# Patient Record
Sex: Female | Born: 1975 | Race: White | Hispanic: No | Marital: Married | Smoking: Never smoker
Health system: Southern US, Community
[De-identification: ages and names within clinical notes are randomized; demographics above are authoritative.]

## PROBLEM LIST (undated history)

## (undated) DIAGNOSIS — J45909 Unspecified asthma, uncomplicated: Secondary | ICD-10-CM

## (undated) DIAGNOSIS — K429 Umbilical hernia without obstruction or gangrene: Secondary | ICD-10-CM

## (undated) HISTORY — PX: WISDOM TOOTH EXTRACTION: SHX21

---

## 2003-06-07 ENCOUNTER — Other Ambulatory Visit: Admission: RE | Admit: 2003-06-07 | Discharge: 2003-06-07 | Payer: Self-pay | Admitting: Obstetrics and Gynecology

## 2003-06-27 ENCOUNTER — Other Ambulatory Visit: Admission: RE | Admit: 2003-06-27 | Discharge: 2003-06-27 | Payer: Self-pay | Admitting: Obstetrics and Gynecology

## 2005-06-18 ENCOUNTER — Other Ambulatory Visit: Admission: RE | Admit: 2005-06-18 | Discharge: 2005-06-18 | Payer: Self-pay | Admitting: Obstetrics and Gynecology

## 2006-10-29 ENCOUNTER — Ambulatory Visit (HOSPITAL_COMMUNITY): Admission: RE | Admit: 2006-10-29 | Discharge: 2006-10-29 | Payer: Self-pay | Admitting: Obstetrics and Gynecology

## 2006-10-31 ENCOUNTER — Inpatient Hospital Stay (HOSPITAL_COMMUNITY): Admission: AD | Admit: 2006-10-31 | Discharge: 2006-10-31 | Payer: Self-pay | Admitting: Obstetrics and Gynecology

## 2006-12-10 ENCOUNTER — Inpatient Hospital Stay (HOSPITAL_COMMUNITY): Admission: AD | Admit: 2006-12-10 | Discharge: 2006-12-12 | Payer: Self-pay | Admitting: Obstetrics and Gynecology

## 2008-10-17 ENCOUNTER — Ambulatory Visit (HOSPITAL_COMMUNITY): Admission: RE | Admit: 2008-10-17 | Discharge: 2008-10-17 | Payer: Self-pay | Admitting: Obstetrics and Gynecology

## 2009-03-06 ENCOUNTER — Encounter (INDEPENDENT_AMBULATORY_CARE_PROVIDER_SITE_OTHER): Payer: Self-pay | Admitting: Obstetrics and Gynecology

## 2009-03-06 ENCOUNTER — Inpatient Hospital Stay (HOSPITAL_COMMUNITY): Admission: RE | Admit: 2009-03-06 | Discharge: 2009-03-08 | Payer: Self-pay | Admitting: Obstetrics and Gynecology

## 2010-11-17 LAB — CBC
HCT: 31.2 % — ABNORMAL LOW (ref 36.0–46.0)
Hemoglobin: 12.5 g/dL (ref 12.0–15.0)
MCV: 86.2 fL (ref 78.0–100.0)
RBC: 3.63 MIL/uL — ABNORMAL LOW (ref 3.87–5.11)
RBC: 4.26 MIL/uL (ref 3.87–5.11)
WBC: 12.2 10*3/uL — ABNORMAL HIGH (ref 4.0–10.5)

## 2010-11-17 LAB — CCBB MATERNAL DONOR DRAW

## 2010-11-17 LAB — RPR: RPR Ser Ql: NONREACTIVE

## 2010-12-27 NOTE — H&P (Signed)
NAMESHERIDA, DOBKINS              ACCOUNT NO.:  [id number]   MEDICAL RECORD NO.:  [id number]          PATIENT TYPE:  INP   LOCATION:  9165                          FACILITY:  [facility]   PHYSICIAN:  Sherron Monday, MD        DATE OF BIRTH:  08/15/1975   DATE OF ADMISSION:  12/10/2006  DATE OF DISCHARGE:                              HISTORY & PHYSICAL   ADMITTING DIAGNOSIS:  Intrauterine pregnancy at term, early labor.   HISTORY OF PRESENT ILLNESS:  A 35 year old Caucasian female, G1, P0 at  41 weeks who states she has had good fetal movement, no loss of fluid,  no vaginal bleeding, and occasional contractions. Her pregnancy has been  uncomplicated except for some lagging fundal height in the third  trimester with a normal growth scan after that.   PAST MEDICAL HISTORY:  Not significant.   PAST SURGICAL HISTORY:  Not significant.   PAST OB/GYN HISTORY:  G1 with no complications. No abnormal Pap smears.  No sexually transmitted diseases.   MEDICATIONS:  prenatal vitamins.   ALLERGIES:  NO KNOWN DRUG ALLERGIES.   SOCIAL HISTORY:  She denies alcohol or tobacco or drug use.  She is  married.   FAMILY HISTORY:  Significant for hypertension on her mother's side of  the family.   PHYSICAL EXAMINATION:  She is afebrile. Vital signs are stable.  GENERAL:     She is in no apparent distress.  CARDIOVASCULAR:  Regular rate, and rhythm.  LUNGS:  Clear to auscultation bilaterally.  ABDOMEN:  Soft, but is nontender.  PELVIC:  Exam in the office on 30th of April, she was 2, 50% and minus 2  station.   ASSESSMENT/PLAN:  She will be admitted to labor and delivery. Her  membranes will be ruptured, and she will be started on Pitocin. Her  labor will be monitored closely.      Sherron Monday, MD  Electronically Signed     JB/MEDQ  D:  12/10/2006  T:  12/10/2006  Job:  [id number]

## 2010-12-27 NOTE — Discharge Summary (Signed)
NAMEMEDA, DUDZINSKI              ACCOUNT NO.:  [id number]   MEDICAL RECORD NO.:  [id number]          PATIENT TYPE:  INP   LOCATION:  9123                          FACILITY:  [facility]   PHYSICIAN:  Malachi Pro. Ambrose Mantle, M.D. DATE OF BIRTH:  Oct 05, 1975   DATE OF ADMISSION:  12/10/2006  DATE OF DISCHARGE:  12/12/2006                               DISCHARGE SUMMARY   This is a 35 year old white female, para 0, gravida 1, at 41+ weeks'  gestation admitted with regular contractions.  The patient had an  uncomplicated prenatal course except for lagging fundal height with  normal growth scan, good fluid at 35 weeks.  Blood group and type O+,  negative antibody, GC and chlamydia negative, RPR nonreactive, rubella  immune, hepatitis B surface antigen negative.  One-hour Glucola 83.  Cystic fibrosis screen negative, quad screen negative.  Group B strep  negative.   The patient's past medical history revealed asthma as a child.  No known  drug allergies.  No surgeries.  No history of STDs.  No abnormal Paps.   The patient is married.   Has a family history of hypertension on her maternal side.   A 10-week ultrasound dated the pregnancy on November 27 at 18 weeks and  5 days; ultrasound with normal anatomy, posterior placenta, female infant.  At 35 weeks because of lagging fundal height, ultrasound showed an  estimated fetal weight at the 13th percentile.  Amniotic fluid index was  normal.   On admission, the patient's vital signs were normal.  Heart and lungs were normal.  The abdomen was soft.  Cervix was 2 cm and 70%.  Fetal heart tones showed occasional variable  decelerations with periods of decreased variability, improved with  oxygen and fluids.   At 2:40 a.m., Dr. Jackelyn Knife was called to see the patient for  decelerations and noticed more episodes of variable decelerations and  decreased variability but then improved.  Cervix was 3 cm, 80%.  By 5:45  a.m., the patient was comfortable  with an epidural.  Fetal heart tones  were overall reassuring with good variability, occasional variable  decelerations.  Cervix was 3 cm.  At 6:50 a.m., the cervix was 4 cm,  90%.  Artificial rupture of membranes produced clear fluid.  At 8:15  a.m., Dr. Senaida Ores saw the patient from variable decelerations.  Fetal  heart rate was notable for recurrent variable decelerations over the  last hour with approximately 50% of contractions, some decreased to as  much as 60 beats per minute.  Otherwise, there was excellent variability  and good scalp stimulation.  Cervix was 5 cm, 90%.  Dr. Senaida Ores  placed an intrauterine pressure catheter for amnioinfusion.  By 12:20  p.m., the patient was comfortable with her epidural.  Cervix was 9.5 cm,  100% vertex at a +1 station.  The patient pushed approximately 1 hour to  deliver a living female infant at 1:23 p.m., 5 pounds 13 ounces with  Apgars of 9 at one and 9 at five minutes.  Placenta was expressed at  1:27 p.m.  First-degree perineal and introital lacerations were  repaired  with 3-0 Vicryl in typical fashion.  Cervix prolapsed through the  introitus.  Blood loss was about 500 mL.  Cervix was inspected.  There  were no lacerations.  Postpartum, the patient did quite well and was  discharged on the second postpartum day.   LABORATORY DATA:  Initial hemoglobin 13.4, hematocrit 39.2,  white count  11,300, platelet count 170,000.  Follow-up hemoglobin 10.5, platelet  count 111,000.  RPR was nonreactive and rapid HIV screen was negative.   FINAL DIAGNOSES:  Intrauterine pregnancy at 41+ weeks, delivered vertex.   OPERATION:  Spontaneous delivery vertex, repair of small laceration.   FINAL CONDITION:  Improved.   INSTRUCTIONS:  Include our regular discharge instruction booklet.  The  patient declines analgesics at discharge, and she is advised to return  to the office in 6 weeks for follow-up examination.      Malachi Pro. Ambrose Mantle, M.D.   Electronically Signed     TFH/MEDQ  D:  12/12/2006  T:  12/12/2006  Job:  [id number]

## 2011-10-19 ENCOUNTER — Ambulatory Visit (INDEPENDENT_AMBULATORY_CARE_PROVIDER_SITE_OTHER): Payer: BC Managed Care – PPO | Admitting: Family Medicine

## 2011-10-19 VITALS — BP 118/74 | HR 77 | Temp 97.8°F | Resp 16 | Ht 64.5 in | Wt 124.4 lb

## 2011-10-19 DIAGNOSIS — Z3040 Encounter for surveillance of contraceptives, unspecified: Secondary | ICD-10-CM

## 2011-10-19 MED ORDER — MOXIFLOXACIN HCL (2X DAY) 0.5 % OP SOLN: 1.0000 [drp] | Freq: Two times a day (BID) | OPHTHALMIC | Status: DC

## 2011-10-19 MED ORDER — LEVOFLOXACIN 500 MG PO TABS: 500.0000 mg | ORAL_TABLET | Freq: Every day | ORAL | Status: AC

## 2011-10-19 NOTE — Progress Notes (Signed)
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  Patient has had sinus congestion, eye discharge, cough, and nasal discharge.  She saw Dr. Wynelle Link 6 days ago but cough has been worsening.  Developed diarrhea last 24 hours.  Some epistaxis.  Taking mucinex and antibiotic eye drops.    O:  NAD Eyes injected. TM's normal Nose:  mp discharge Throat:  Red and swollen tonsils Chest:  Strong cough, wheezes bilaterally.  No rales.  A:  Bronchitis, sinusitis, acute...worsening conjunctivitis P:  Levaquin 500 mg bid x7d Moxeza bid x 5 days

## 2011-10-28 ENCOUNTER — Telehealth: Payer: Self-pay

## 2011-10-28 MED ORDER — DPH-LIDO-ALHYDR-MGHYDR-SIMETH MT SUSP: 5.0000 mL | Freq: Four times a day (QID) | OROMUCOSAL | Status: DC | PRN

## 2011-10-28 NOTE — Telephone Encounter (Signed)
Spoke with pt gave msg from Maralyn Sago and advised to pick up RX from [facility]. Pt understood

## 2011-10-28 NOTE — Telephone Encounter (Signed)
Oral sores from Levaquin would be a very unusual reaction to the med, it is also possible her initial illness caused this symptom.  Since she is almost finished with her Levaquin (day 9) I suggest she stop her antibiotic and can rinse her mouth with Dukes Magic Mouthwash as needed for the next several days.  Eprescribed to [facility]

## 2011-10-28 NOTE — Telephone Encounter (Signed)
Pt called and reported that she has developed a rash (couple of red very sore spots on her tongue), starting Sunday but worse yesterday from the Levaquin. She only has one more dose tonight and it has really helped the sinusitis/bronchitis. Is there something we can give her for this rash, or should she take Benedryl? Pt reports she has no other SEs, no swelling, SOB, rash anywhere else, no yeast infection Sxs.

## 2015-12-24 DIAGNOSIS — R300 Dysuria: Secondary | ICD-10-CM | POA: Diagnosis not present

## 2015-12-24 DIAGNOSIS — N390 Urinary tract infection, site not specified: Secondary | ICD-10-CM | POA: Diagnosis not present

## 2016-05-19 DIAGNOSIS — D225 Melanocytic nevi of trunk: Secondary | ICD-10-CM | POA: Diagnosis not present

## 2016-05-19 DIAGNOSIS — D1800 Hemangioma unspecified site: Secondary | ICD-10-CM | POA: Diagnosis not present

## 2016-05-19 DIAGNOSIS — L814 Other melanin hyperpigmentation: Secondary | ICD-10-CM | POA: Diagnosis not present

## 2016-05-19 DIAGNOSIS — L821 Other seborrheic keratosis: Secondary | ICD-10-CM | POA: Diagnosis not present

## 2016-09-03 DIAGNOSIS — Z130 Encounter for screening for diseases of the blood and blood-forming organs and certain disorders involving the immune mechanism: Secondary | ICD-10-CM | POA: Diagnosis not present

## 2016-09-03 DIAGNOSIS — Z6823 Body mass index (BMI) 23.0-23.9, adult: Secondary | ICD-10-CM | POA: Diagnosis not present

## 2016-09-03 DIAGNOSIS — K429 Umbilical hernia without obstruction or gangrene: Secondary | ICD-10-CM | POA: Diagnosis not present

## 2016-09-03 DIAGNOSIS — Z975 Presence of (intrauterine) contraceptive device: Secondary | ICD-10-CM | POA: Diagnosis not present

## 2016-09-03 DIAGNOSIS — Z01419 Encounter for gynecological examination (general) (routine) without abnormal findings: Secondary | ICD-10-CM | POA: Diagnosis not present

## 2016-09-03 DIAGNOSIS — Z1231 Encounter for screening mammogram for malignant neoplasm of breast: Secondary | ICD-10-CM | POA: Diagnosis not present

## 2016-09-03 DIAGNOSIS — Z1389 Encounter for screening for other disorder: Secondary | ICD-10-CM | POA: Diagnosis not present

## 2016-09-11 ENCOUNTER — Other Ambulatory Visit: Payer: Self-pay | Admitting: Obstetrics and Gynecology

## 2016-09-11 DIAGNOSIS — N6489 Other specified disorders of breast: Secondary | ICD-10-CM

## 2016-09-11 DIAGNOSIS — N6310 Unspecified lump in the right breast, unspecified quadrant: Secondary | ICD-10-CM

## 2016-09-12 DIAGNOSIS — K429 Umbilical hernia without obstruction or gangrene: Secondary | ICD-10-CM | POA: Diagnosis not present

## 2016-09-17 ENCOUNTER — Ambulatory Visit
Admission: RE | Admit: 2016-09-17 | Discharge: 2016-09-17 | Disposition: A | Discharge status: Home / Self Care | Payer: BLUE CROSS/BLUE SHIELD | Source: Ambulatory Visit | Attending: Obstetrics and Gynecology | Admitting: Obstetrics and Gynecology

## 2016-09-17 ENCOUNTER — Other Ambulatory Visit: Payer: Self-pay | Admitting: Obstetrics and Gynecology

## 2016-09-17 DIAGNOSIS — N6310 Unspecified lump in the right breast, unspecified quadrant: Secondary | ICD-10-CM

## 2016-09-17 DIAGNOSIS — N6489 Other specified disorders of breast: Secondary | ICD-10-CM

## 2016-09-17 DIAGNOSIS — N6312 Unspecified lump in the right breast, upper inner quadrant: Secondary | ICD-10-CM | POA: Diagnosis not present

## 2016-09-17 DIAGNOSIS — N6311 Unspecified lump in the right breast, upper outer quadrant: Secondary | ICD-10-CM | POA: Diagnosis not present

## 2016-09-29 DIAGNOSIS — J029 Acute pharyngitis, unspecified: Secondary | ICD-10-CM | POA: Diagnosis not present

## 2016-12-23 DIAGNOSIS — H5213 Myopia, bilateral: Secondary | ICD-10-CM | POA: Diagnosis not present

## 2017-04-16 ENCOUNTER — Ambulatory Visit: Payer: Self-pay | Admitting: General Surgery

## 2017-05-20 ENCOUNTER — Encounter (HOSPITAL_BASED_OUTPATIENT_CLINIC_OR_DEPARTMENT_OTHER): Payer: Self-pay | Admitting: *Deleted

## 2017-05-20 NOTE — Progress Notes (Signed)
Npo after midnight arrive 630 am 05-28-17 [facility] needs urine pregancy, hemaglobin. Husband steve driver

## 2017-05-21 DIAGNOSIS — D1800 Hemangioma unspecified site: Secondary | ICD-10-CM | POA: Diagnosis not present

## 2017-05-21 DIAGNOSIS — D225 Melanocytic nevi of trunk: Secondary | ICD-10-CM | POA: Diagnosis not present

## 2017-05-21 DIAGNOSIS — D485 Neoplasm of uncertain behavior of skin: Secondary | ICD-10-CM | POA: Diagnosis not present

## 2017-05-21 DIAGNOSIS — Z23 Encounter for immunization: Secondary | ICD-10-CM | POA: Diagnosis not present

## 2017-05-21 DIAGNOSIS — L814 Other melanin hyperpigmentation: Secondary | ICD-10-CM | POA: Diagnosis not present

## 2017-05-28 ENCOUNTER — Ambulatory Visit (HOSPITAL_BASED_OUTPATIENT_CLINIC_OR_DEPARTMENT_OTHER): Payer: BLUE CROSS/BLUE SHIELD | Admitting: Anesthesiology

## 2017-05-28 ENCOUNTER — Encounter (HOSPITAL_BASED_OUTPATIENT_CLINIC_OR_DEPARTMENT_OTHER)
Admission: RE | Disposition: A | Discharge status: Home / Self Care | Payer: Self-pay | Source: Ambulatory Visit | Attending: General Surgery

## 2017-05-28 ENCOUNTER — Encounter (HOSPITAL_BASED_OUTPATIENT_CLINIC_OR_DEPARTMENT_OTHER): Payer: Self-pay | Admitting: *Deleted

## 2017-05-28 ENCOUNTER — Ambulatory Visit (HOSPITAL_BASED_OUTPATIENT_CLINIC_OR_DEPARTMENT_OTHER)
Admission: RE | Admit: 2017-05-28 | Discharge: 2017-05-28 | Disposition: A | Discharge status: Home / Self Care | Payer: BLUE CROSS/BLUE SHIELD | Source: Ambulatory Visit | Attending: General Surgery | Admitting: General Surgery

## 2017-05-28 DIAGNOSIS — J45909 Unspecified asthma, uncomplicated: Secondary | ICD-10-CM | POA: Diagnosis not present

## 2017-05-28 DIAGNOSIS — Z79899 Other long term (current) drug therapy: Secondary | ICD-10-CM | POA: Insufficient documentation

## 2017-05-28 DIAGNOSIS — K429 Umbilical hernia without obstruction or gangrene: Secondary | ICD-10-CM | POA: Diagnosis not present

## 2017-05-28 HISTORY — PX: UMBILICAL HERNIA REPAIR: SHX196

## 2017-05-28 HISTORY — DX: Umbilical hernia without obstruction or gangrene: K42.9

## 2017-05-28 HISTORY — DX: Unspecified asthma, uncomplicated: J45.909

## 2017-05-28 LAB — POCT HEMOGLOBIN-HEMACUE: HEMOGLOBIN: 13.6 g/dL (ref 12.0–15.0)

## 2017-05-28 LAB — POCT PREGNANCY, URINE: PREG TEST UR: NEGATIVE

## 2017-05-28 SURGERY — REPAIR, HERNIA, UMBILICAL, LAPAROSCOPIC
Anesthesia: General

## 2017-05-28 MED ORDER — CEFAZOLIN SODIUM-DEXTROSE 2-4 GM/100ML-% IV SOLN
INTRAVENOUS | Status: AC
  Filled 2017-05-28: qty 100

## 2017-05-28 MED ORDER — ONDANSETRON HCL 4 MG/2ML IJ SOLN
INTRAMUSCULAR | Status: AC
  Filled 2017-05-28: qty 2

## 2017-05-28 MED ORDER — CELECOXIB 400 MG PO CAPS
400.0000 mg | ORAL_CAPSULE | ORAL | Status: AC
  Administered 2017-05-28: 400 mg via ORAL
  Filled 2017-05-28: qty 1

## 2017-05-28 MED ORDER — FENTANYL CITRATE (PF) 100 MCG/2ML IJ SOLN
INTRAMUSCULAR | Status: DC | PRN
  Administered 2017-05-28: 50 ug via INTRAVENOUS
  Administered 2017-05-28 (×2): 25 ug via INTRAVENOUS

## 2017-05-28 MED ORDER — CELECOXIB 200 MG PO CAPS
ORAL_CAPSULE | ORAL | Status: AC
  Filled 2017-05-28: qty 2

## 2017-05-28 MED ORDER — ROCURONIUM BROMIDE 50 MG/5ML IV SOSY
PREFILLED_SYRINGE | INTRAVENOUS | Status: AC
Start: 2017-05-28 — End: 2017-05-28
  Filled 2017-05-28: qty 5

## 2017-05-28 MED ORDER — MIDAZOLAM HCL 2 MG/2ML IJ SOLN
INTRAMUSCULAR | Status: AC
  Filled 2017-05-28: qty 2

## 2017-05-28 MED ORDER — ACETAMINOPHEN 500 MG PO TABS
ORAL_TABLET | ORAL | Status: AC
  Filled 2017-05-28: qty 2

## 2017-05-28 MED ORDER — ROCURONIUM BROMIDE 100 MG/10ML IV SOLN
INTRAVENOUS | Status: DC | PRN
  Administered 2017-05-28: 30 mg via INTRAVENOUS

## 2017-05-28 MED ORDER — GABAPENTIN 300 MG PO CAPS
300.0000 mg | ORAL_CAPSULE | ORAL | Status: AC
  Administered 2017-05-28: 300 mg via ORAL
  Filled 2017-05-28: qty 1

## 2017-05-28 MED ORDER — KETOROLAC TROMETHAMINE 30 MG/ML IJ SOLN
INTRAMUSCULAR | Status: DC | PRN
  Administered 2017-05-28: 30 mg via INTRAVENOUS

## 2017-05-28 MED ORDER — LACTATED RINGERS IV SOLN
INTRAVENOUS | Status: DC
  Administered 2017-05-28 (×2): via INTRAVENOUS
  Filled 2017-05-28: qty 1000

## 2017-05-28 MED ORDER — PROPOFOL 10 MG/ML IV BOLUS
INTRAVENOUS | Status: AC
  Filled 2017-05-28: qty 40

## 2017-05-28 MED ORDER — CHLORHEXIDINE GLUCONATE 4 % EX LIQD
60.0000 mL | Freq: Once | CUTANEOUS | Status: DC
  Filled 2017-05-28: qty 118

## 2017-05-28 MED ORDER — SUGAMMADEX SODIUM 200 MG/2ML IV SOLN
INTRAVENOUS | Status: AC
  Filled 2017-05-28: qty 2

## 2017-05-28 MED ORDER — FENTANYL CITRATE (PF) 100 MCG/2ML IJ SOLN
25.0000 ug | INTRAMUSCULAR | Status: DC | PRN
  Filled 2017-05-28: qty 1

## 2017-05-28 MED ORDER — ACETAMINOPHEN 500 MG PO TABS
1000.0000 mg | ORAL_TABLET | ORAL | Status: AC
  Administered 2017-05-28: 1000 mg via ORAL
  Filled 2017-05-28: qty 2

## 2017-05-28 MED ORDER — LIDOCAINE HCL (CARDIAC) 20 MG/ML IV SOLN
INTRAVENOUS | Status: DC | PRN
  Administered 2017-05-28: 60 mg via INTRAVENOUS

## 2017-05-28 MED ORDER — HYDROCODONE-ACETAMINOPHEN 5-325 MG PO TABS: 1.0000 | ORAL_TABLET | Freq: Four times a day (QID) | ORAL | 0 refills | Status: AC | PRN

## 2017-05-28 MED ORDER — PROPOFOL 10 MG/ML IV BOLUS
INTRAVENOUS | Status: DC | PRN
  Administered 2017-05-28: 200 mg via INTRAVENOUS

## 2017-05-28 MED ORDER — BUPIVACAINE HCL 0.5 % IJ SOLN
INTRAMUSCULAR | Status: DC | PRN
  Administered 2017-05-28: 30 mL

## 2017-05-28 MED ORDER — BUPIVACAINE LIPOSOME 1.3 % IJ SUSP
INTRAMUSCULAR | Status: DC | PRN
  Administered 2017-05-28: 20 mL

## 2017-05-28 MED ORDER — ONDANSETRON HCL 4 MG/2ML IJ SOLN
INTRAMUSCULAR | Status: DC | PRN
  Administered 2017-05-28: 4 mg via INTRAVENOUS

## 2017-05-28 MED ORDER — LIDOCAINE 2% (20 MG/ML) 5 ML SYRINGE
INTRAMUSCULAR | Status: AC
  Filled 2017-05-28: qty 5

## 2017-05-28 MED ORDER — SUGAMMADEX SODIUM 200 MG/2ML IV SOLN
INTRAVENOUS | Status: DC | PRN
  Administered 2017-05-28: 200 mg via INTRAVENOUS

## 2017-05-28 MED ORDER — DEXAMETHASONE SODIUM PHOSPHATE 4 MG/ML IJ SOLN
INTRAMUSCULAR | Status: DC | PRN
  Administered 2017-05-28: 10 mg via INTRAVENOUS

## 2017-05-28 MED ORDER — GABAPENTIN 300 MG PO CAPS
ORAL_CAPSULE | ORAL | Status: AC
  Filled 2017-05-28: qty 1

## 2017-05-28 MED ORDER — MIDAZOLAM HCL 5 MG/5ML IJ SOLN
INTRAMUSCULAR | Status: DC | PRN
  Administered 2017-05-28: 2 mg via INTRAVENOUS

## 2017-05-28 MED ORDER — IBUPROFEN 800 MG PO TABS: 800.0000 mg | ORAL_TABLET | Freq: Three times a day (TID) | ORAL | 0 refills | Status: AC | PRN

## 2017-05-28 MED ORDER — MEPERIDINE HCL 25 MG/ML IJ SOLN
6.2500 mg | INTRAMUSCULAR | Status: DC | PRN
  Filled 2017-05-28: qty 1

## 2017-05-28 MED ORDER — ONDANSETRON HCL 4 MG/2ML IJ SOLN
4.0000 mg | Freq: Once | INTRAMUSCULAR | Status: DC | PRN
  Filled 2017-05-28: qty 2

## 2017-05-28 MED ORDER — FENTANYL CITRATE (PF) 100 MCG/2ML IJ SOLN
INTRAMUSCULAR | Status: AC
  Filled 2017-05-28: qty 2

## 2017-05-28 MED ORDER — CEFAZOLIN SODIUM-DEXTROSE 2-4 GM/100ML-% IV SOLN
2.0000 g | INTRAVENOUS | Status: AC
  Administered 2017-05-28: 2 g via INTRAVENOUS
  Filled 2017-05-28: qty 100

## 2017-05-28 MED ORDER — DEXAMETHASONE SODIUM PHOSPHATE 10 MG/ML IJ SOLN
INTRAMUSCULAR | Status: AC
  Filled 2017-05-28: qty 1

## 2017-05-28 SURGICAL SUPPLY — 49 items
ADH SKN CLS APL DERMABOND .7 (GAUZE/BANDAGES/DRESSINGS) ×1
APPLIER CLIP 5 13 M/L LIGAMAX5 (MISCELLANEOUS)
APR CLP MED LRG 5 ANG JAW (MISCELLANEOUS)
BINDER ABDOMINAL 12 ML 46-62 (SOFTGOODS) IMPLANT
BLADE SURG 11 STRL SS (BLADE) ×2 IMPLANT
CHLORAPREP W/TINT 26ML (MISCELLANEOUS) ×2 IMPLANT
CLIP APPLIE 5 13 M/L LIGAMAX5 (MISCELLANEOUS) IMPLANT
COVER BACK TABLE 60X90IN (DRAPES) ×2 IMPLANT
COVER MAYO STAND STRL (DRAPES) ×2 IMPLANT
DECANTER SPIKE VIAL GLASS SM (MISCELLANEOUS) ×2 IMPLANT
DERMABOND ADVANCED (GAUZE/BANDAGES/DRESSINGS) ×1
DERMABOND ADVANCED .7 DNX12 (GAUZE/BANDAGES/DRESSINGS) ×1 IMPLANT
DEVICE PMI PUNCTURE CLOSURE (MISCELLANEOUS) ×2 IMPLANT
DEVICE SECURE STRAP 25 ABSORB (INSTRUMENTS) IMPLANT
DRAIN CHANNEL 19F RND (DRAIN) IMPLANT
DRAPE LAPAROSCOPIC ABDOMINAL (DRAPES) ×2 IMPLANT
DRAPE UTILITY XL STRL (DRAPES) ×2 IMPLANT
ELECT REM PT RETURN 9FT ADLT (ELECTROSURGICAL) ×2
ELECTRODE REM PT RTRN 9FT ADLT (ELECTROSURGICAL) ×1 IMPLANT
EVACUATOR SILICONE 100CC (DRAIN) IMPLANT
GLOVE BIOGEL PI IND STRL 7.0 (GLOVE) ×1 IMPLANT
GLOVE BIOGEL PI INDICATOR 7.0 (GLOVE) ×1
GLOVE SURG SS PI 7.0 STRL IVOR (GLOVE) ×2 IMPLANT
GOWN STRL REUS W/ TWL LRG LVL3 (GOWN DISPOSABLE) ×2 IMPLANT
GOWN STRL REUS W/TWL LRG LVL3 (GOWN DISPOSABLE) ×4
MARKER SKIN DUAL TIP RULER LAB (MISCELLANEOUS) IMPLANT
NS IRRIG 500ML POUR BTL (IV SOLUTION) ×1 IMPLANT
PACK BASIN DAY SURGERY FS (CUSTOM PROCEDURE TRAY) ×2 IMPLANT
PADDING ION DISPOSABLE (MISCELLANEOUS) IMPLANT
PENCIL BUTTON HOLSTER BLD 10FT (ELECTRODE) ×1 IMPLANT
SCISSORS LAP 5X35 DISP (ENDOMECHANICALS) ×1 IMPLANT
SET IRRIG TUBING LAPAROSCOPIC (IRRIGATION / IRRIGATOR) IMPLANT
SHEARS HARMONIC ACE PLUS 36CM (ENDOMECHANICALS) IMPLANT
SLEEVE XCEL OPT CAN 5 100 (ENDOMECHANICALS) ×1 IMPLANT
SPONGE LAP 4X18 X RAY DECT (DISPOSABLE) ×1 IMPLANT
SUT ETHIBOND CT1 BRD #0 30IN (SUTURE) ×4 IMPLANT
SUT ETHILON 2 0 PS N (SUTURE) IMPLANT
SUT MNCRL AB 4-0 PS2 18 (SUTURE) ×2 IMPLANT
SUT PROLENE 0 CT 1 CR/8 (SUTURE) ×1 IMPLANT
SUT VICRYL 0 UR6 27IN ABS (SUTURE) IMPLANT
TOWEL OR 17X24 6PK STRL BLUE (TOWEL DISPOSABLE) ×4 IMPLANT
TRAY DSU PREP LF (CUSTOM PROCEDURE TRAY) IMPLANT
TRAY FOLEY W/METER SILVER 14FR (SET/KITS/TRAYS/PACK) IMPLANT
TRAY LAPAROSCOPIC (CUSTOM PROCEDURE TRAY) ×1 IMPLANT
TROCAR BLADELESS OPT 5 100 (ENDOMECHANICALS) ×2 IMPLANT
TROCAR XCEL 12X100 BLDLESS (ENDOMECHANICALS) IMPLANT
TROCAR XCEL NON-BLD 11X100MML (ENDOMECHANICALS) ×1 IMPLANT
TUBING INSUF HEATED (TUBING) ×1 IMPLANT
YANKAUER SUCT BULB TIP NO VENT (SUCTIONS) ×1 IMPLANT

## 2017-05-28 NOTE — Discharge Instructions (Signed)

## 2017-05-28 NOTE — Anesthesia Preprocedure Evaluation (Signed)
Anesthesia Evaluation  Patient identified by MRN, date of birth, ID band Patient awake    Reviewed: Allergy & Precautions, NPO status , Patient's Chart, lab work & pertinent test results  Airway Mallampati: II  TM Distance: >3 FB Neck ROM: Full    Dental no notable dental hx.    Pulmonary neg pulmonary ROS,    Pulmonary exam normal breath sounds clear to auscultation       Cardiovascular negative cardio ROS Normal cardiovascular exam Rhythm:Regular Rate:Normal     Neuro/Psych negative neurological ROS  negative psych ROS   GI/Hepatic negative GI ROS, Neg liver ROS,   Endo/Other  negative endocrine ROS  Renal/GU negative Renal ROS  negative genitourinary   Musculoskeletal negative musculoskeletal ROS (+)   Abdominal   Peds negative pediatric ROS (+)  Hematology negative hematology ROS (+)   Anesthesia Other Findings   Reproductive/Obstetrics negative OB ROS                             Anesthesia Physical Anesthesia Plan  ASA: II  Anesthesia Plan: General   Post-op Pain Management:    Induction: Intravenous  PONV Risk Score and Plan: 2 and Ondansetron, Dexamethasone, Treatment may vary due to age or medical condition, Midazolam and Scopolamine patch - Pre-op  Airway Management Planned: Oral ETT  Additional Equipment:   Intra-op Plan:   Post-operative Plan: Extubation in OR  Informed Consent:   Plan Discussed with:   Anesthesia Plan Comments: (  )        Anesthesia Quick Evaluation

## 2017-05-28 NOTE — Anesthesia Postprocedure Evaluation (Signed)
Anesthesia Post Note  Patient: Erika Mccormick  Procedure(s) Performed: UMBILICAL HERNIA REPAIR (N/A )     Patient location during evaluation: PACU Anesthesia Type: General Level of consciousness: awake and alert Pain management: pain level controlled Vital Signs Assessment: post-procedure vital signs reviewed and stable Respiratory status: spontaneous breathing, nonlabored ventilation, respiratory function stable and patient connected to nasal cannula oxygen Cardiovascular status: blood pressure returned to baseline and stable Postop Assessment: no apparent nausea or vomiting Anesthetic complications: no    Last Vitals:  Vitals:   05/28/17 0631 05/28/17 0932  BP: 123/75 126/84  Pulse: 88 (!) 127  Resp: 14 15  Temp: 36.9 C 36.9 C  SpO2: 99% 100%    Last Pain:  Vitals:   05/28/17 0631  TempSrc: Oral                 Venera Privott

## 2017-05-28 NOTE — Op Note (Signed)
PATIENT:  Erika Mccormick  41 y.o. female  PRE-OPERATIVE DIAGNOSIS:  UMBILICAL HERNIA   POST-OPERATIVE DIAGNOSIS:  UMBILICAL HERNIA   PROCEDURE:  Procedure(s): UMBILICAL HERNIA REPAIR   SURGEON:  Surgeon(s): Carey Lafon, De BlanchLuke Aaron, MD  ASSISTANT: none   ANESTHESIA:   local and general  Indications for procedure: Erika AbtsMargaret F Filosa is a 41 y.o. year old female with symptoms of abdominal pain and bloating.  Description of procedure: The patient was brought into the operative suite. Anesthesia was administered with General endotracheal anesthesia. WHO checklist was applied. The patient was then placed in supine. The area was prepped and draped in the usual sterile fashion.  Next the infraumbilical skin was anesthetized with Exparel:Marcaine mix. A semilunar infraumbilical incision was made. Cautery and blunt dissection was used to dissect down to the fascia. The hernia sac was dissected free from surrounding tissues in 360 degrees. The umbilical skin was dissected free of the hernia sac with cautery. The contents of the hernia sac were reduced. The hernia defect was 1.5 cm in diameter. The hernia sac was removed. Due to the size of the hernia, no mesh was utilized. The fascial defect was then primarily closed with interrupted 0 prolene sutures. The umbilical skin was sutured to the fascia with a 3-0 vicryl. The deep dermal space was closed with a 3-0 vicryl. Marcaine:Exparel mix was injected into the muscle layer and around the fascia. The skin was closed with a 4-0 monocryl subcuticular suture. Dermabond was put in place for dressing. The patient awoke from anesthesia and was brought to pacu in stable condition. All counts were correct.  Findings: 1.5cm umbilical hernia, primary closure  Specimen: none  Blood loss: Total I/O In: 200 [I.V.:200] Out: 20 [Blood:20] ml  Local anesthesia: 50 ml Exparel: 0.5% Marcaine mix  Complications: none  PLAN OF CARE: Discharge to home after  PACU  PATIENT DISPOSITION:  PACU - hemodynamically stable.  Feliciana RossettiLuke Jenniefer Salak, M.D. General, Bariatric, & Minimally Invasive Surgery [facility]  05/28/2017 9:22 AM

## 2017-05-28 NOTE — Anesthesia Procedure Notes (Signed)
Procedure Name: Intubation Date/Time: 05/28/2017 8:42 AM Performed by: Justice Rocher Pre-anesthesia Checklist: Patient identified, Emergency Drugs available, Suction available and Patient being monitored Patient Re-evaluated:Patient Re-evaluated prior to induction Oxygen Delivery Method: Circle system utilized Preoxygenation: Pre-oxygenation with 100% oxygen Induction Type: IV induction Ventilation: Mask ventilation without difficulty Laryngoscope Size: Mac and 3 Grade View: Grade I Tube type: Oral Tube size: 7.0 mm Number of attempts: 1 Airway Equipment and Method: Stylet and Oral airway Placement Confirmation: ETT inserted through vocal cords under direct vision,  positive ETCO2 and breath sounds checked- equal and bilateral Secured at: 21 cm Tube secured with: Tape Dental Injury: Teeth and Oropharynx as per pre-operative assessment

## 2017-05-28 NOTE — Transfer of Care (Signed)
Immediate Anesthesia Transfer of Care Note  Patient: Erika Mccormick  Procedure(s) Performed: Procedure(s) (LRB): UMBILICAL HERNIA REPAIR (N/A)  Patient Location: PACU  Anesthesia Type: General  Level of Consciousness: awake, sedated, patient cooperative and responds to stimulation  Airway & Oxygen Therapy: Patient Spontanous Breathing and Patient connected to [place] oxygen  Post-op Assessment: Report given to PACU RN, Post -op Vital signs reviewed and stable and Patient moving all extremities  Post vital signs: Reviewed and stable  Complications: No apparent anesthesia complications

## 2017-05-28 NOTE — H&P (Signed)
Erika AbtsMargaret F Mccormick is an 41 y.o. female.   Chief Complaint: hernia HPI: 41 yo female with long history of small umbilical hernia. Recently it has become more painful. She has some bloating symptoms. She denies nausea or vomiting.  Past Medical History:  Diagnosis Date  . Asthma    childhood  . Umbilical hernia     Past Surgical History:  Procedure Laterality Date  . WISDOM TOOTH EXTRACTION      History reviewed. No pertinent family history. Social History:  reports that she has never smoked. She has never used smokeless tobacco. She reports that she drinks alcohol. She reports that she does not use drugs.  Allergies: No Known Allergies  Medications Prior to Admission  Medication Sig Dispense Refill  . fluticasone (FLONASE) 50 MCG/ACT nasal spray Place into both nostrils every evening.    . DPH-Lido-AlHydr-MgHydr-Simeth SUSP Use as directed 5 mLs in the mouth or throat 4 (four) times daily as needed. 120 mL 0  . ibuprofen (ADVIL,MOTRIN) 400 MG tablet Take 400 mg by mouth every 6 (six) hours as needed.    Marland Kitchen. levonorgestrel (MIRENA) 20 MCG/24HR IUD 1 each by Intrauterine route once.    . Moxifloxacin HCl 0.5 % SOLN Apply 1 drop to eye 2 (two) times daily. 1 Bottle 0    No results found for this or any previous visit (from the past 48 hour(s)). No results found.  Review of Systems  Constitutional: Negative for chills and fever.  HENT: Negative for hearing loss.   Eyes: Negative for blurred vision and double vision.  Respiratory: Negative for cough and hemoptysis.   Cardiovascular: Negative for chest pain and palpitations.  Gastrointestinal: Positive for abdominal pain. Negative for nausea and vomiting.  Genitourinary: Negative for dysuria and urgency.  Musculoskeletal: Negative for myalgias and neck pain.  Skin: Negative for itching and rash.  Neurological: Negative for dizziness, tingling and headaches.  Endo/Heme/Allergies: Does not bruise/bleed easily.   Psychiatric/Behavioral: Negative for depression and suicidal ideas.    Blood pressure 123/75, pulse 88, temperature 98.5 F (36.9 C), temperature source Oral, resp. rate 14, height 5\' 5"  (1.651 m), weight 64.9 kg (143 lb), last menstrual period 04/28/2017, SpO2 99 %. Physical Exam  Vitals reviewed. Constitutional: She is oriented to person, place, and time. She appears well-developed and well-nourished.  HENT:  Head: Normocephalic and atraumatic.  Eyes: Pupils are equal, round, and reactive to light. Conjunctivae and EOM are normal.  Neck: Normal range of motion. Neck supple.  Cardiovascular: Normal rate and regular rhythm.   Respiratory: Effort normal and breath sounds normal.  GI: Soft. Bowel sounds are normal. She exhibits no distension. There is no tenderness.  Umbilical hernia  Musculoskeletal: Normal range of motion.  Neurological: She is alert and oriented to person, place, and time.  Skin: Skin is warm and dry.  Psychiatric: She has a normal mood and affect. Her behavior is normal.     Assessment/Plan 41 yo female with umbilical hernia -open umbilical hernia repair with possible mesh insertion -planned outpatient recovery  Rodman PickleLuke Aaron Floride Hutmacher, MD 05/28/2017, 7:14 AM

## 2017-05-29 ENCOUNTER — Encounter (HOSPITAL_BASED_OUTPATIENT_CLINIC_OR_DEPARTMENT_OTHER): Payer: Self-pay | Admitting: General Surgery

## 2017-09-13 DIAGNOSIS — J069 Acute upper respiratory infection, unspecified: Secondary | ICD-10-CM | POA: Diagnosis not present

## 2017-09-13 DIAGNOSIS — R05 Cough: Secondary | ICD-10-CM | POA: Diagnosis not present

## 2017-10-30 ENCOUNTER — Other Ambulatory Visit: Payer: Self-pay | Admitting: Obstetrics and Gynecology

## 2017-10-30 DIAGNOSIS — Z1231 Encounter for screening mammogram for malignant neoplasm of breast: Secondary | ICD-10-CM

## 2017-11-24 ENCOUNTER — Ambulatory Visit
Admission: RE | Admit: 2017-11-24 | Discharge: 2017-11-24 | Disposition: A | Discharge status: Home / Self Care | Payer: BLUE CROSS/BLUE SHIELD | Source: Ambulatory Visit | Attending: Obstetrics and Gynecology | Admitting: Obstetrics and Gynecology

## 2017-11-24 DIAGNOSIS — Z1231 Encounter for screening mammogram for malignant neoplasm of breast: Secondary | ICD-10-CM

## 2017-11-25 ENCOUNTER — Other Ambulatory Visit: Payer: Self-pay | Admitting: Obstetrics and Gynecology

## 2017-11-25 DIAGNOSIS — R928 Other abnormal and inconclusive findings on diagnostic imaging of breast: Secondary | ICD-10-CM

## 2017-11-25 DIAGNOSIS — H5213 Myopia, bilateral: Secondary | ICD-10-CM | POA: Diagnosis not present

## 2017-12-04 ENCOUNTER — Ambulatory Visit
Admission: RE | Admit: 2017-12-04 | Discharge: 2017-12-04 | Disposition: A | Discharge status: Home / Self Care | Payer: BLUE CROSS/BLUE SHIELD | Source: Ambulatory Visit | Attending: Obstetrics and Gynecology | Admitting: Obstetrics and Gynecology

## 2017-12-04 DIAGNOSIS — R928 Other abnormal and inconclusive findings on diagnostic imaging of breast: Secondary | ICD-10-CM

## 2017-12-04 DIAGNOSIS — R922 Inconclusive mammogram: Secondary | ICD-10-CM | POA: Diagnosis not present

## 2017-12-04 DIAGNOSIS — N6489 Other specified disorders of breast: Secondary | ICD-10-CM | POA: Diagnosis not present

## 2018-05-25 DIAGNOSIS — L814 Other melanin hyperpigmentation: Secondary | ICD-10-CM | POA: Diagnosis not present

## 2018-05-25 DIAGNOSIS — D225 Melanocytic nevi of trunk: Secondary | ICD-10-CM | POA: Diagnosis not present

## 2018-05-25 DIAGNOSIS — Z23 Encounter for immunization: Secondary | ICD-10-CM | POA: Diagnosis not present

## 2018-05-25 DIAGNOSIS — L821 Other seborrheic keratosis: Secondary | ICD-10-CM | POA: Diagnosis not present

## 2018-10-08 DIAGNOSIS — G5701 Lesion of sciatic nerve, right lower limb: Secondary | ICD-10-CM | POA: Diagnosis not present

## 2018-10-11 DIAGNOSIS — M5410 Radiculopathy, site unspecified: Secondary | ICD-10-CM | POA: Diagnosis not present

## 2018-10-15 DIAGNOSIS — Z0000 Encounter for general adult medical examination without abnormal findings: Secondary | ICD-10-CM | POA: Diagnosis not present

## 2018-10-15 DIAGNOSIS — Z13220 Encounter for screening for lipoid disorders: Secondary | ICD-10-CM | POA: Diagnosis not present

## 2018-11-17 ENCOUNTER — Other Ambulatory Visit: Payer: Self-pay | Admitting: Family Medicine

## 2018-11-17 DIAGNOSIS — Z1231 Encounter for screening mammogram for malignant neoplasm of breast: Secondary | ICD-10-CM

## 2019-01-20 ENCOUNTER — Other Ambulatory Visit: Payer: Self-pay

## 2019-01-20 ENCOUNTER — Ambulatory Visit
Admission: RE | Admit: 2019-01-20 | Discharge: 2019-01-20 | Disposition: A | Discharge status: Home / Self Care | Payer: BLUE CROSS/BLUE SHIELD | Source: Ambulatory Visit | Attending: Family Medicine | Admitting: Family Medicine

## 2019-01-20 DIAGNOSIS — Z1231 Encounter for screening mammogram for malignant neoplasm of breast: Secondary | ICD-10-CM

## 2019-05-10 DIAGNOSIS — N390 Urinary tract infection, site not specified: Secondary | ICD-10-CM | POA: Diagnosis not present

## 2019-05-31 DIAGNOSIS — D225 Melanocytic nevi of trunk: Secondary | ICD-10-CM | POA: Diagnosis not present

## 2019-05-31 DIAGNOSIS — L821 Other seborrheic keratosis: Secondary | ICD-10-CM | POA: Diagnosis not present

## 2019-05-31 DIAGNOSIS — L814 Other melanin hyperpigmentation: Secondary | ICD-10-CM | POA: Diagnosis not present

## 2019-06-23 DIAGNOSIS — H5213 Myopia, bilateral: Secondary | ICD-10-CM | POA: Diagnosis not present

## 2019-07-27 DIAGNOSIS — Z20828 Contact with and (suspected) exposure to other viral communicable diseases: Secondary | ICD-10-CM | POA: Diagnosis not present

## 2019-07-27 DIAGNOSIS — Z9189 Other specified personal risk factors, not elsewhere classified: Secondary | ICD-10-CM | POA: Diagnosis not present

## 2019-11-01 DIAGNOSIS — Z1329 Encounter for screening for other suspected endocrine disorder: Secondary | ICD-10-CM | POA: Diagnosis not present

## 2019-11-01 DIAGNOSIS — Z131 Encounter for screening for diabetes mellitus: Secondary | ICD-10-CM | POA: Diagnosis not present

## 2019-11-01 DIAGNOSIS — Z0000 Encounter for general adult medical examination without abnormal findings: Secondary | ICD-10-CM | POA: Diagnosis not present

## 2019-11-01 DIAGNOSIS — Z13220 Encounter for screening for lipoid disorders: Secondary | ICD-10-CM | POA: Diagnosis not present

## 2019-11-01 DIAGNOSIS — Z130 Encounter for screening for diseases of the blood and blood-forming organs and certain disorders involving the immune mechanism: Secondary | ICD-10-CM | POA: Diagnosis not present

## 2019-11-03 DIAGNOSIS — U071 COVID-19: Secondary | ICD-10-CM | POA: Diagnosis not present

## 2019-11-03 DIAGNOSIS — R509 Fever, unspecified: Secondary | ICD-10-CM | POA: Diagnosis not present

## 2019-11-07 ENCOUNTER — Other Ambulatory Visit: Payer: BC Managed Care – PPO

## 2019-11-07 DIAGNOSIS — U071 COVID-19: Secondary | ICD-10-CM | POA: Diagnosis not present

## 2019-11-14 ENCOUNTER — Ambulatory Visit: Payer: BC Managed Care – PPO | Attending: Internal Medicine

## 2019-11-14 DIAGNOSIS — Z20822 Contact with and (suspected) exposure to covid-19: Secondary | ICD-10-CM | POA: Diagnosis not present

## 2019-11-15 ENCOUNTER — Telehealth: Payer: Self-pay | Admitting: *Deleted

## 2019-11-15 LAB — NOVEL CORONAVIRUS, NAA: SARS-CoV-2, NAA: NOT DETECTED

## 2019-11-15 LAB — SARS-COV-2, NAA 2 DAY TAT

## 2019-11-15 NOTE — Telephone Encounter (Signed)
Patient called for results ,still pending . 

## 2019-12-20 ENCOUNTER — Other Ambulatory Visit: Payer: Self-pay | Admitting: Family Medicine

## 2019-12-20 DIAGNOSIS — Z1231 Encounter for screening mammogram for malignant neoplasm of breast: Secondary | ICD-10-CM

## 2020-01-24 ENCOUNTER — Other Ambulatory Visit: Payer: Self-pay

## 2020-01-24 ENCOUNTER — Ambulatory Visit
Admission: RE | Admit: 2020-01-24 | Discharge: 2020-01-24 | Disposition: A | Discharge status: Home / Self Care | Payer: BC Managed Care – PPO | Source: Ambulatory Visit

## 2020-01-24 DIAGNOSIS — Z1231 Encounter for screening mammogram for malignant neoplasm of breast: Secondary | ICD-10-CM

## 2020-01-26 ENCOUNTER — Other Ambulatory Visit: Payer: Self-pay | Admitting: Family Medicine

## 2020-01-26 DIAGNOSIS — R928 Other abnormal and inconclusive findings on diagnostic imaging of breast: Secondary | ICD-10-CM

## 2020-02-02 ENCOUNTER — Other Ambulatory Visit: Payer: Self-pay

## 2020-02-02 ENCOUNTER — Ambulatory Visit
Admission: RE | Admit: 2020-02-02 | Discharge: 2020-02-02 | Disposition: A | Discharge status: Home / Self Care | Payer: BC Managed Care – PPO | Source: Ambulatory Visit | Attending: Family Medicine | Admitting: Family Medicine

## 2020-02-02 DIAGNOSIS — N6011 Diffuse cystic mastopathy of right breast: Secondary | ICD-10-CM | POA: Diagnosis not present

## 2020-02-02 DIAGNOSIS — R928 Other abnormal and inconclusive findings on diagnostic imaging of breast: Secondary | ICD-10-CM

## 2020-02-02 DIAGNOSIS — N6012 Diffuse cystic mastopathy of left breast: Secondary | ICD-10-CM | POA: Diagnosis not present

## 2020-03-29 DIAGNOSIS — Z01419 Encounter for gynecological examination (general) (routine) without abnormal findings: Secondary | ICD-10-CM | POA: Diagnosis not present

## 2020-03-29 DIAGNOSIS — Z130 Encounter for screening for diseases of the blood and blood-forming organs and certain disorders involving the immune mechanism: Secondary | ICD-10-CM | POA: Diagnosis not present

## 2020-03-29 DIAGNOSIS — Z1151 Encounter for screening for human papillomavirus (HPV): Secondary | ICD-10-CM | POA: Diagnosis not present

## 2020-03-29 DIAGNOSIS — Z124 Encounter for screening for malignant neoplasm of cervix: Secondary | ICD-10-CM | POA: Diagnosis not present

## 2020-03-29 DIAGNOSIS — Z6822 Body mass index (BMI) 22.0-22.9, adult: Secondary | ICD-10-CM | POA: Diagnosis not present

## 2020-06-12 DIAGNOSIS — D2271 Melanocytic nevi of right lower limb, including hip: Secondary | ICD-10-CM | POA: Diagnosis not present

## 2020-06-12 DIAGNOSIS — D225 Melanocytic nevi of trunk: Secondary | ICD-10-CM | POA: Diagnosis not present

## 2020-06-12 DIAGNOSIS — L821 Other seborrheic keratosis: Secondary | ICD-10-CM | POA: Diagnosis not present

## 2020-06-12 DIAGNOSIS — D485 Neoplasm of uncertain behavior of skin: Secondary | ICD-10-CM | POA: Diagnosis not present

## 2020-06-12 DIAGNOSIS — L814 Other melanin hyperpigmentation: Secondary | ICD-10-CM | POA: Diagnosis not present

## 2020-08-08 DIAGNOSIS — Z23 Encounter for immunization: Secondary | ICD-10-CM | POA: Diagnosis not present

## 2020-08-08 DIAGNOSIS — N390 Urinary tract infection, site not specified: Secondary | ICD-10-CM | POA: Diagnosis not present

## 2020-11-12 DIAGNOSIS — Z0000 Encounter for general adult medical examination without abnormal findings: Secondary | ICD-10-CM | POA: Diagnosis not present

## 2020-11-12 DIAGNOSIS — Z13220 Encounter for screening for lipoid disorders: Secondary | ICD-10-CM | POA: Diagnosis not present

## 2021-01-01 ENCOUNTER — Other Ambulatory Visit: Payer: Self-pay | Admitting: Family Medicine

## 2021-01-01 DIAGNOSIS — Z1231 Encounter for screening mammogram for malignant neoplasm of breast: Secondary | ICD-10-CM

## 2021-02-22 ENCOUNTER — Other Ambulatory Visit: Payer: Self-pay

## 2021-02-22 ENCOUNTER — Ambulatory Visit
Admission: RE | Admit: 2021-02-22 | Discharge: 2021-02-22 | Disposition: A | Discharge status: Home / Self Care | Payer: BC Managed Care – PPO | Source: Ambulatory Visit

## 2021-02-22 DIAGNOSIS — Z1231 Encounter for screening mammogram for malignant neoplasm of breast: Secondary | ICD-10-CM

## 2021-06-17 DIAGNOSIS — D225 Melanocytic nevi of trunk: Secondary | ICD-10-CM | POA: Diagnosis not present

## 2021-06-17 DIAGNOSIS — L821 Other seborrheic keratosis: Secondary | ICD-10-CM | POA: Diagnosis not present

## 2021-06-17 DIAGNOSIS — L578 Other skin changes due to chronic exposure to nonionizing radiation: Secondary | ICD-10-CM | POA: Diagnosis not present

## 2021-06-17 DIAGNOSIS — L814 Other melanin hyperpigmentation: Secondary | ICD-10-CM | POA: Diagnosis not present

## 2021-06-26 DIAGNOSIS — Z130 Encounter for screening for diseases of the blood and blood-forming organs and certain disorders involving the immune mechanism: Secondary | ICD-10-CM | POA: Diagnosis not present

## 2021-06-26 DIAGNOSIS — Z01419 Encounter for gynecological examination (general) (routine) without abnormal findings: Secondary | ICD-10-CM | POA: Diagnosis not present

## 2021-06-26 DIAGNOSIS — N915 Oligomenorrhea, unspecified: Secondary | ICD-10-CM | POA: Diagnosis not present

## 2021-06-26 DIAGNOSIS — Z1389 Encounter for screening for other disorder: Secondary | ICD-10-CM | POA: Diagnosis not present

## 2021-06-26 DIAGNOSIS — Z6824 Body mass index (BMI) 24.0-24.9, adult: Secondary | ICD-10-CM | POA: Diagnosis not present

## 2021-07-17 ENCOUNTER — Other Ambulatory Visit: Payer: Self-pay | Admitting: Obstetrics and Gynecology

## 2021-07-17 DIAGNOSIS — N6320 Unspecified lump in the left breast, unspecified quadrant: Secondary | ICD-10-CM

## 2021-08-29 ENCOUNTER — Ambulatory Visit
Admission: RE | Admit: 2021-08-29 | Discharge: 2021-08-29 | Disposition: A | Discharge status: Home / Self Care | Payer: BC Managed Care – PPO | Source: Ambulatory Visit | Attending: Obstetrics and Gynecology | Admitting: Obstetrics and Gynecology

## 2021-08-29 ENCOUNTER — Other Ambulatory Visit: Payer: Self-pay

## 2021-08-29 DIAGNOSIS — R922 Inconclusive mammogram: Secondary | ICD-10-CM | POA: Diagnosis not present

## 2021-08-29 DIAGNOSIS — N6320 Unspecified lump in the left breast, unspecified quadrant: Secondary | ICD-10-CM

## 2021-11-29 DIAGNOSIS — Z0000 Encounter for general adult medical examination without abnormal findings: Secondary | ICD-10-CM | POA: Diagnosis not present

## 2021-11-29 DIAGNOSIS — Z23 Encounter for immunization: Secondary | ICD-10-CM | POA: Diagnosis not present

## 2021-11-29 DIAGNOSIS — Z13220 Encounter for screening for lipoid disorders: Secondary | ICD-10-CM | POA: Diagnosis not present

## 2022-01-01 IMAGING — MG DIGITAL SCREENING BILAT W/ TOMO W/ CAD
8 series · 8 of 24 positions shown · non-contrast
Comparison: Prior films

CLINICAL DATA: Screening.

EXAM:
DIGITAL SCREENING BILATERAL MAMMOGRAM WITH TOMO AND CAD

[R MLO synth-2D]
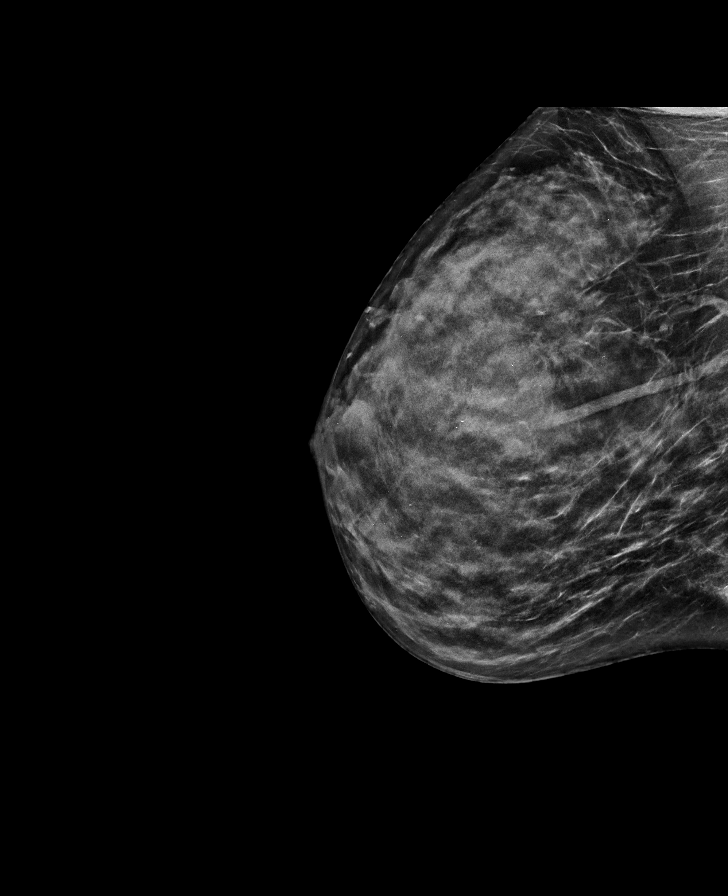

[L CC synth-2D]
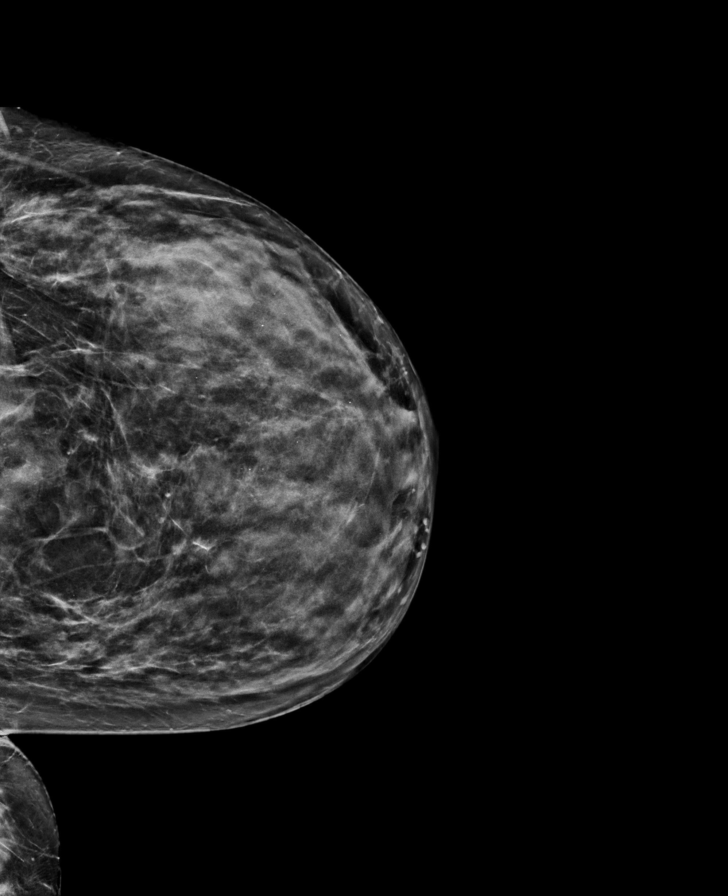

[L MLO synth-2D]
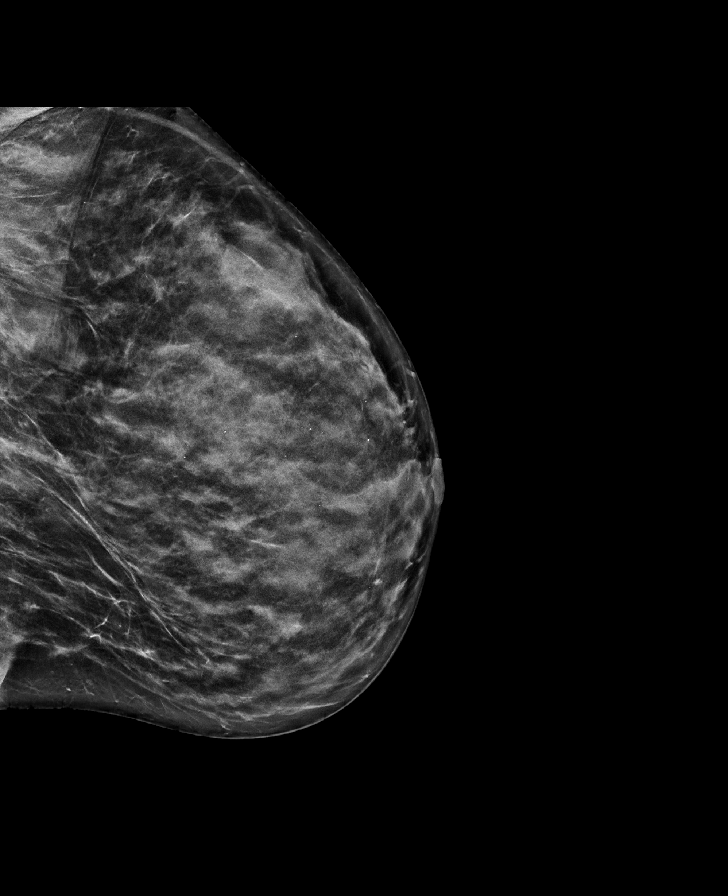

[R CC synth-2D]
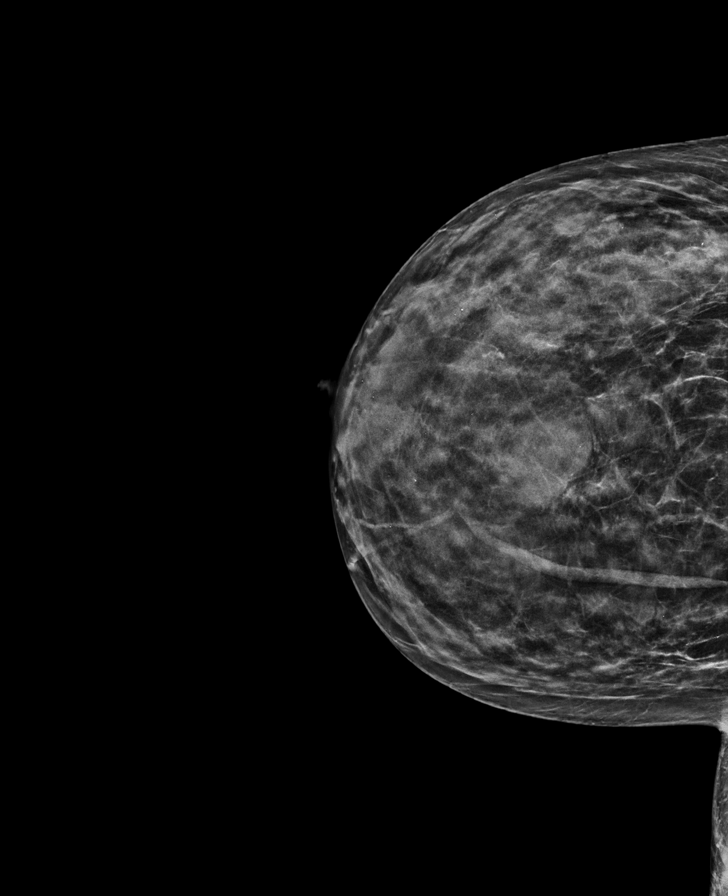

[R MLO tomo · tomo slice 31/60.0]
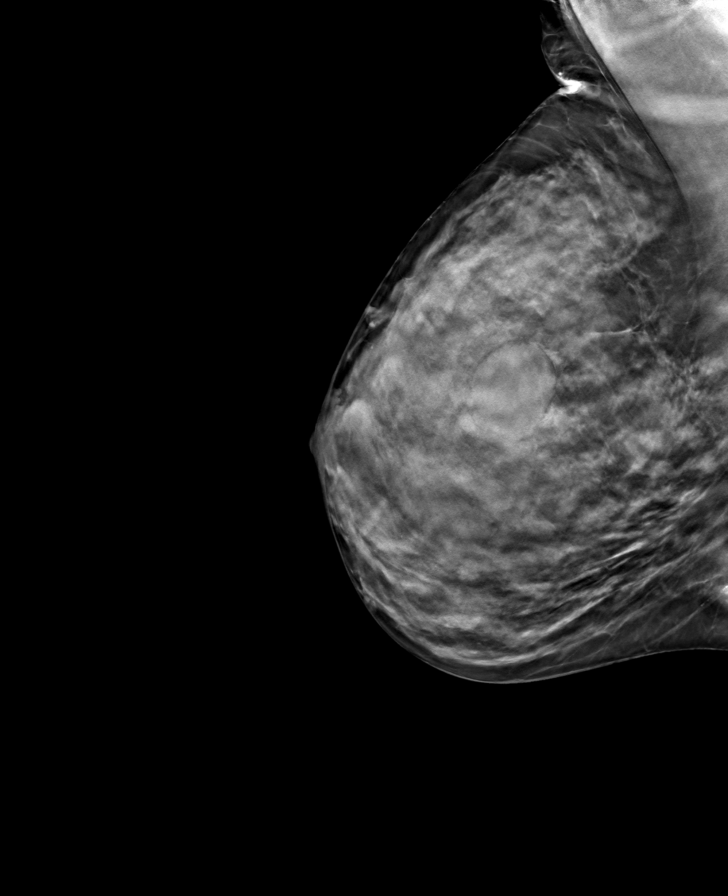

[L MLO tomo · tomo slice 31/61.0]
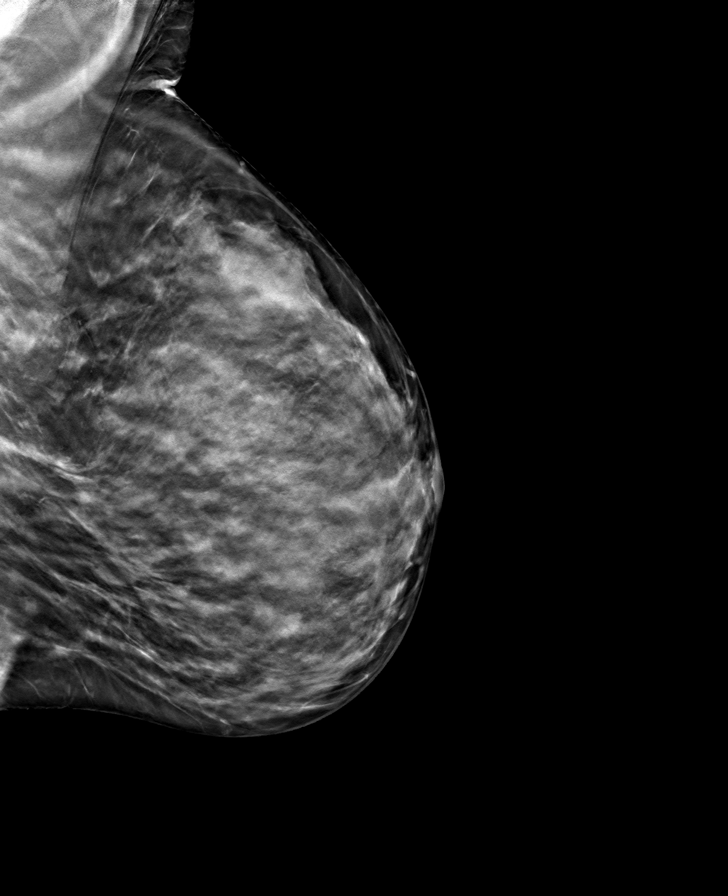

[R CC tomo · tomo slice 28/55.0]
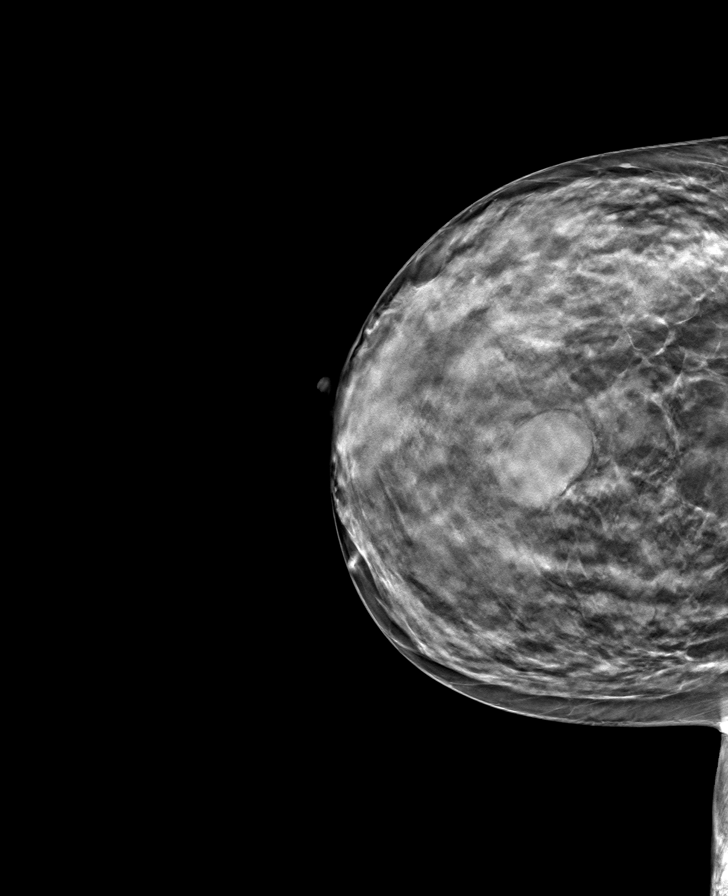

[L CC tomo · tomo slice 31/60.0]
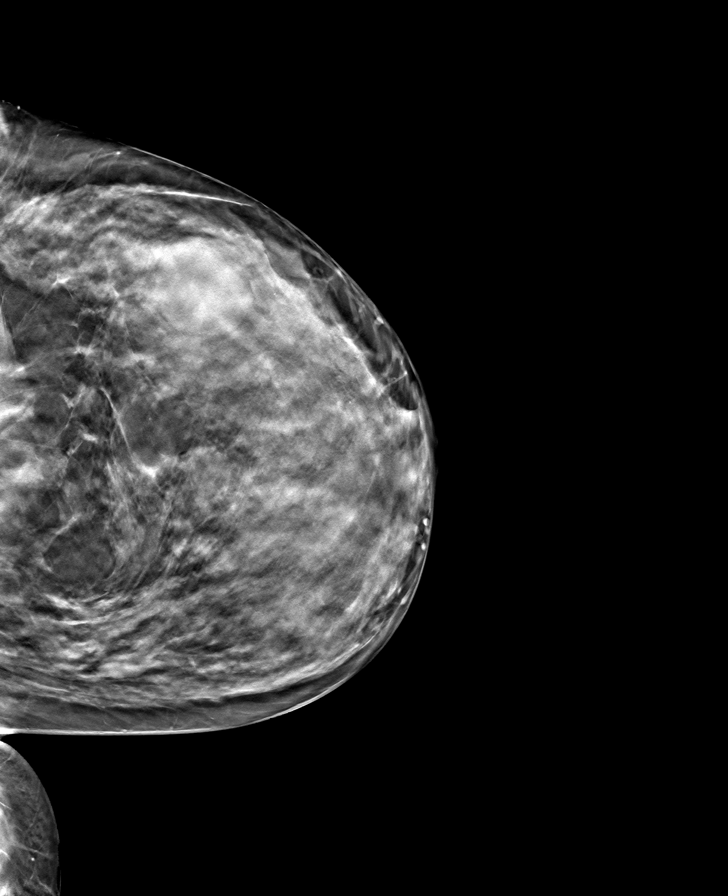

[8 of 24 positions shown; findings below may reference images not displayed]

ACR Breast Density Category d: The breast tissue is extremely dense,
which lowers the sensitivity of mammography.
FINDINGS: In the right breast possible mass requires further evaluation.

In the left breast possible mass requires further evaluation.

Images were processed with CAD.
IMPRESSION: Further evaluation is suggested for possible mass in the right
breast.

Further evaluation is suggested for possible mass in the left
breast.

RECOMMENDATION:
ultrasound of both breasts. (Code:[id number])

The patient will be contacted regarding the findings, and additional
imaging will be scheduled.

BI-RADS CATEGORY  0: Incomplete. Need additional imaging evaluation
and/or prior mammograms for comparison.

## 2022-02-20 ENCOUNTER — Other Ambulatory Visit: Payer: Self-pay | Admitting: Obstetrics and Gynecology

## 2022-02-20 DIAGNOSIS — Z1231 Encounter for screening mammogram for malignant neoplasm of breast: Secondary | ICD-10-CM

## 2022-03-05 ENCOUNTER — Ambulatory Visit
Admission: RE | Admit: 2022-03-05 | Discharge: 2022-03-05 | Disposition: A | Discharge status: Home / Self Care | Payer: BC Managed Care – PPO | Source: Ambulatory Visit | Attending: Obstetrics and Gynecology | Admitting: Obstetrics and Gynecology

## 2022-03-05 DIAGNOSIS — Z1231 Encounter for screening mammogram for malignant neoplasm of breast: Secondary | ICD-10-CM | POA: Diagnosis not present

## 2022-07-01 DIAGNOSIS — D225 Melanocytic nevi of trunk: Secondary | ICD-10-CM | POA: Diagnosis not present

## 2022-07-29 DIAGNOSIS — L0291 Cutaneous abscess, unspecified: Secondary | ICD-10-CM | POA: Diagnosis not present

## 2022-10-03 DIAGNOSIS — Z01419 Encounter for gynecological examination (general) (routine) without abnormal findings: Secondary | ICD-10-CM | POA: Diagnosis not present

## 2022-10-03 DIAGNOSIS — N915 Oligomenorrhea, unspecified: Secondary | ICD-10-CM | POA: Diagnosis not present

## 2022-10-03 DIAGNOSIS — Z1389 Encounter for screening for other disorder: Secondary | ICD-10-CM | POA: Diagnosis not present

## 2022-10-03 DIAGNOSIS — Z130 Encounter for screening for diseases of the blood and blood-forming organs and certain disorders involving the immune mechanism: Secondary | ICD-10-CM | POA: Diagnosis not present

## 2022-12-17 DIAGNOSIS — Z0000 Encounter for general adult medical examination without abnormal findings: Secondary | ICD-10-CM | POA: Diagnosis not present

## 2022-12-17 DIAGNOSIS — Z13220 Encounter for screening for lipoid disorders: Secondary | ICD-10-CM | POA: Diagnosis not present

## 2023-01-29 ENCOUNTER — Other Ambulatory Visit: Payer: Self-pay | Admitting: Obstetrics and Gynecology

## 2023-01-29 DIAGNOSIS — Z0000 Encounter for general adult medical examination without abnormal findings: Secondary | ICD-10-CM

## 2023-02-24 DIAGNOSIS — J209 Acute bronchitis, unspecified: Secondary | ICD-10-CM | POA: Diagnosis not present

## 2023-02-24 DIAGNOSIS — R300 Dysuria: Secondary | ICD-10-CM | POA: Diagnosis not present

## 2023-03-05 DIAGNOSIS — Z1211 Encounter for screening for malignant neoplasm of colon: Secondary | ICD-10-CM | POA: Diagnosis not present

## 2023-03-09 ENCOUNTER — Ambulatory Visit
Admission: RE | Admit: 2023-03-09 | Discharge: 2023-03-09 | Disposition: A | Discharge status: Home / Self Care | Payer: BC Managed Care – PPO | Source: Ambulatory Visit | Attending: Obstetrics and Gynecology | Admitting: Obstetrics and Gynecology

## 2023-03-09 DIAGNOSIS — Z0000 Encounter for general adult medical examination without abnormal findings: Secondary | ICD-10-CM

## 2023-03-09 DIAGNOSIS — Z1231 Encounter for screening mammogram for malignant neoplasm of breast: Secondary | ICD-10-CM | POA: Diagnosis not present

## 2023-06-21 ENCOUNTER — Other Ambulatory Visit: Payer: Self-pay

## 2023-06-21 ENCOUNTER — Encounter (HOSPITAL_COMMUNITY): Payer: Self-pay | Admitting: Emergency Medicine

## 2023-06-21 ENCOUNTER — Emergency Department (HOSPITAL_COMMUNITY): Payer: BC Managed Care – PPO

## 2023-06-21 ENCOUNTER — Emergency Department (HOSPITAL_COMMUNITY)
Admission: EM | Admit: 2023-06-21 | Discharge: 2023-06-21 | Disposition: A | Discharge status: Home / Self Care | Payer: BC Managed Care – PPO | Attending: Emergency Medicine | Admitting: Emergency Medicine

## 2023-06-21 DIAGNOSIS — K838 Other specified diseases of biliary tract: Secondary | ICD-10-CM | POA: Diagnosis not present

## 2023-06-21 DIAGNOSIS — R112 Nausea with vomiting, unspecified: Secondary | ICD-10-CM | POA: Diagnosis not present

## 2023-06-21 DIAGNOSIS — R109 Unspecified abdominal pain: Secondary | ICD-10-CM | POA: Diagnosis not present

## 2023-06-21 DIAGNOSIS — R1010 Upper abdominal pain, unspecified: Secondary | ICD-10-CM | POA: Diagnosis not present

## 2023-06-21 DIAGNOSIS — R1013 Epigastric pain: Secondary | ICD-10-CM | POA: Diagnosis not present

## 2023-06-21 DIAGNOSIS — R748 Abnormal levels of other serum enzymes: Secondary | ICD-10-CM | POA: Diagnosis not present

## 2023-06-21 DIAGNOSIS — Z9049 Acquired absence of other specified parts of digestive tract: Secondary | ICD-10-CM | POA: Diagnosis not present

## 2023-06-21 LAB — COMPREHENSIVE METABOLIC PANEL
ALT: 28 U/L (ref 0–44)
AST: 30 U/L (ref 15–41)
Albumin: 4.4 g/dL (ref 3.5–5.0)
Alkaline Phosphatase: 64 U/L (ref 38–126)
Anion gap: 11 (ref 5–15)
BUN: 15 mg/dL (ref 6–20)
CO2: 21 mmol/L — ABNORMAL LOW (ref 22–32)
Calcium: 8.9 mg/dL (ref 8.9–10.3)
Chloride: 103 mmol/L (ref 98–111)
Creatinine, Ser: 0.7 mg/dL (ref 0.44–1.00)
GFR, Estimated: >60 mL/min (ref >60)
Glucose, Bld: 129 mg/dL — ABNORMAL HIGH (ref 70–99)
Potassium: 3 mmol/L — ABNORMAL LOW (ref 3.5–5.1)
Sodium: 135 mmol/L (ref 135–145)
Total Bilirubin: 0.5 mg/dL (ref <1.2)
Total Protein: 7.1 g/dL (ref 6.5–8.1)

## 2023-06-21 LAB — CBC
HCT: 47.7 % — ABNORMAL HIGH (ref 36.0–46.0)
Hemoglobin: 15.5 g/dL — ABNORMAL HIGH (ref 12.0–15.0)
MCH: 29 pg (ref 26.0–34.0)
MCHC: 32.5 g/dL (ref 30.0–36.0)
MCV: 89.2 fL (ref 80.0–100.0)
Platelets: 248 10*3/uL (ref 150–400)
RBC: 5.35 MIL/uL — ABNORMAL HIGH (ref 3.87–5.11)
RDW: 13 % (ref 11.5–15.5)
WBC: 9.8 10*3/uL (ref 4.0–10.5)
nRBC: 0 % (ref 0.0–0.2)

## 2023-06-21 LAB — HCG, SERUM, QUALITATIVE: Preg, Serum: NEGATIVE

## 2023-06-21 LAB — LIPASE, BLOOD: Lipase: 504 U/L — ABNORMAL HIGH (ref 11–51)

## 2023-06-21 MED ORDER — ONDANSETRON 4 MG PO TBDP: ORAL_TABLET | ORAL | 0 refills | Status: AC

## 2023-06-21 MED ORDER — ONDANSETRON 4 MG PO TBDP
4.0000 mg | ORAL_TABLET | Freq: Once | ORAL | Status: AC | PRN
  Administered 2023-06-21: 4 mg via ORAL
  Filled 2023-06-21: qty 1

## 2023-06-21 MED ORDER — SODIUM CHLORIDE 0.9 % IV BOLUS
2000.0000 mL | Freq: Once | INTRAVENOUS | Status: AC
  Administered 2023-06-21: 2000 mL via INTRAVENOUS

## 2023-06-21 MED ORDER — IOHEXOL 300 MG/ML  SOLN
100.0000 mL | Freq: Once | INTRAMUSCULAR | Status: AC | PRN
  Administered 2023-06-21: 100 mL via INTRAVENOUS

## 2023-06-21 MED ORDER — PANTOPRAZOLE SODIUM 40 MG IV SOLR
40.0000 mg | Freq: Once | INTRAVENOUS | Status: AC
  Administered 2023-06-21: 40 mg via INTRAVENOUS
  Filled 2023-06-21: qty 10

## 2023-06-21 NOTE — ED Triage Notes (Signed)
Pt reports sudden onset of abd pain, N/V after eating dinner. Pt reports generalized pain that feels like burning. Reports she has never had pain like this before.

## 2023-06-21 NOTE — Discharge Instructions (Signed)
Take liquids only for the next 24 hours.  Follow-up with your family doctor this week.  Do not drink any alcohol.  Return if your symptoms become worse.

## 2023-06-21 NOTE — ED Provider Notes (Signed)
[place] EMERGENCY DEPARTMENT AT [facility] Provider Note   CSN: [id number] Arrival date & time: 06/21/23  1938     History {Add pertinent medical, surgical, social history, OB history to HPI:1} Chief Complaint  Patient presents with   Abdominal Pain   Emesis    Erika Mccormick is a 47 y.o. female.  States that she had some abdominal pain and nausea along with vomiting after dinner tonight.  Patient was not vomiting any blood.  No diarrhea  The history is provided by the patient and medical records. No language interpreter was used.  Abdominal Pain Pain location:  Epigastric Pain quality: aching   Pain radiates to:  Does not radiate Pain severity:  Mild Onset quality:  Sudden Timing:  Intermittent Progression:  Waxing and waning Chronicity:  New Context: alcohol use   Associated symptoms: vomiting   Associated symptoms: no chest pain, no cough, no diarrhea, no fatigue and no hematuria   Emesis Associated symptoms: abdominal pain   Associated symptoms: no cough, no diarrhea and no headaches        Home Medications Prior to Admission medications   Medication Sig Start Date End Date Taking? Authorizing Provider  fluticasone (FLONASE) 50 MCG/ACT nasal spray Place into both nostrils every evening.    [provider]  HYDROcodone-acetaminophen (NORCO/VICODIN) 5-325 MG tablet Take 1-2 tablets by mouth every 6 (six) hours as needed for moderate pain. 05/28/17   Kinsinger, De Blanch, MD  ibuprofen (ADVIL,MOTRIN) 800 MG tablet Take 1 tablet (800 mg total) by mouth every 8 (eight) hours as needed. 05/28/17   Kinsinger, De Blanch, MD      Allergies    Patient has no known allergies.    Review of Systems   Review of Systems  Constitutional:  Negative for appetite change and fatigue.  HENT:  Negative for congestion, ear discharge and sinus pressure.   Eyes:  Negative for discharge.  Respiratory:  Negative for cough.   Cardiovascular:  Negative  for chest pain.  Gastrointestinal:  Positive for abdominal pain and vomiting. Negative for diarrhea.  Genitourinary:  Negative for frequency and hematuria.  Musculoskeletal:  Negative for back pain.  Skin:  Negative for rash.  Neurological:  Negative for seizures and headaches.  Psychiatric/Behavioral:  Negative for hallucinations.     Physical Exam Updated Vital Signs BP 113/82 (BP Location: Right Arm)   Pulse 79   Resp 16   SpO2 97%  Physical Exam Vitals and nursing note reviewed.  Constitutional:      Appearance: She is well-developed.  HENT:     Head: Normocephalic.     Nose: Nose normal.  Eyes:     General: No scleral icterus.    Conjunctiva/sclera: Conjunctivae normal.  Neck:     Thyroid: No thyromegaly.  Cardiovascular:     Rate and Rhythm: Normal rate and regular rhythm.     Heart sounds: No murmur heard.    No friction rub. No gallop.  Pulmonary:     Breath sounds: No stridor. No wheezing or rales.  Chest:     Chest wall: No tenderness.  Abdominal:     General: There is no distension.     Tenderness: There is abdominal tenderness. There is no rebound.  Musculoskeletal:        General: Normal range of motion.     Cervical back: Neck supple.  Lymphadenopathy:     Cervical: No cervical adenopathy.  Skin:    Findings: No erythema or  rash.  Neurological:     Mental Status: She is alert and oriented to person, place, and time.     Motor: No abnormal muscle tone.     Coordination: Coordination normal.  Psychiatric:        Behavior: Behavior normal.     ED Results / Procedures / Treatments   Labs (all labs ordered are listed, but only abnormal results are displayed) Labs Reviewed  LIPASE, BLOOD - Abnormal; Notable for the following components:      Result Value   Lipase 504 (*)    All other components within normal limits  COMPREHENSIVE METABOLIC PANEL - Abnormal; Notable for the following components:   Potassium 3.0 (*)    CO2 21 (*)    Glucose, Bld  129 (*)    All other components within normal limits  CBC - Abnormal; Notable for the following components:   RBC 5.35 (*)    Hemoglobin 15.5 (*)    HCT 47.7 (*)    All other components within normal limits  HCG, SERUM, QUALITATIVE  URINALYSIS, ROUTINE W REFLEX MICROSCOPIC    EKG None  Radiology No results found.  Procedures Procedures  {Document cardiac monitor, telemetry assessment procedure when appropriate:1}  Medications Ordered in ED Medications  ondansetron (ZOFRAN-ODT) disintegrating tablet 4 mg (4 mg Oral Given 06/21/23 1958)  sodium chloride 0.9 % bolus 2,000 mL (2,000 mLs Intravenous New Bag/Given 06/21/23 2110)  pantoprazole (PROTONIX) injection 40 mg (40 mg Intravenous Given 06/21/23 2109)  iohexol (OMNIPAQUE) 300 MG/ML solution 100 mL (100 mLs Intravenous Contrast Given 06/21/23 2148)    ED Course/ Medical Decision Making/ A&P   {Patient with abdominal pain and elevated lipase.  CT scan does not show any gallbladder.  Patient states she did not have her gallbladder removed.  I spoke with the radiologist about this and they still do not see a gallbladder. Click here for ABCD2, HEART and other calculatorsREFRESH Note before signing :1}                              Medical Decision Making Amount and/or Complexity of Data Reviewed Labs: ordered. Radiology: ordered.  Risk Prescription drug management.   Abdominal pain and elevated lipase.  Patient is sent home on Zofran and told to just take liquids for the next 24 hours and follow-up with her PCP.  If she has any more problems before then she should return  {Document critical care time when appropriate:1} {Document review of labs and clinical decision tools ie heart score, Chads2Vasc2 etc:1}  {Document your independent review of radiology images, and any outside records:1} {Document your discussion with family members, caretakers, and with consultants:1} {Document social determinants of health affecting pt's  care:1} {Document your decision making why or why not admission, treatments were needed:1} Final Clinical Impression(s) / ED Diagnoses Final diagnoses:  None    Rx / DC Orders ED Discharge Orders     None

## 2023-06-26 DIAGNOSIS — R1030 Lower abdominal pain, unspecified: Secondary | ICD-10-CM | POA: Diagnosis not present

## 2023-06-26 DIAGNOSIS — K838 Other specified diseases of biliary tract: Secondary | ICD-10-CM | POA: Diagnosis not present

## 2023-06-26 DIAGNOSIS — R109 Unspecified abdominal pain: Secondary | ICD-10-CM | POA: Diagnosis not present

## 2023-06-26 DIAGNOSIS — R112 Nausea with vomiting, unspecified: Secondary | ICD-10-CM | POA: Diagnosis not present

## 2023-06-30 ENCOUNTER — Other Ambulatory Visit: Payer: Self-pay | Admitting: Family Medicine

## 2023-06-30 DIAGNOSIS — R109 Unspecified abdominal pain: Secondary | ICD-10-CM

## 2023-06-30 DIAGNOSIS — K838 Other specified diseases of biliary tract: Secondary | ICD-10-CM

## 2023-07-01 ENCOUNTER — Ambulatory Visit
Admission: RE | Admit: 2023-07-01 | Discharge: 2023-07-01 | Disposition: A | Discharge status: Home / Self Care | Payer: BC Managed Care – PPO | Source: Ambulatory Visit | Attending: Family Medicine | Admitting: Family Medicine

## 2023-07-01 DIAGNOSIS — K838 Other specified diseases of biliary tract: Secondary | ICD-10-CM | POA: Diagnosis not present

## 2023-07-01 DIAGNOSIS — R109 Unspecified abdominal pain: Secondary | ICD-10-CM

## 2023-07-01 DIAGNOSIS — K8020 Calculus of gallbladder without cholecystitis without obstruction: Secondary | ICD-10-CM | POA: Diagnosis not present

## 2023-07-08 DIAGNOSIS — R1084 Generalized abdominal pain: Secondary | ICD-10-CM | POA: Diagnosis not present

## 2023-07-08 DIAGNOSIS — K529 Noninfective gastroenteritis and colitis, unspecified: Secondary | ICD-10-CM | POA: Diagnosis not present

## 2023-07-14 DIAGNOSIS — L814 Other melanin hyperpigmentation: Secondary | ICD-10-CM | POA: Diagnosis not present

## 2023-07-14 DIAGNOSIS — D225 Melanocytic nevi of trunk: Secondary | ICD-10-CM | POA: Diagnosis not present

## 2023-07-14 DIAGNOSIS — L821 Other seborrheic keratosis: Secondary | ICD-10-CM | POA: Diagnosis not present

## 2023-07-14 DIAGNOSIS — L578 Other skin changes due to chronic exposure to nonionizing radiation: Secondary | ICD-10-CM | POA: Diagnosis not present

## 2023-07-21 DIAGNOSIS — R748 Abnormal levels of other serum enzymes: Secondary | ICD-10-CM | POA: Diagnosis not present

## 2023-07-21 DIAGNOSIS — K8020 Calculus of gallbladder without cholecystitis without obstruction: Secondary | ICD-10-CM | POA: Diagnosis not present

## 2023-07-21 DIAGNOSIS — K8050 Calculus of bile duct without cholangitis or cholecystitis without obstruction: Secondary | ICD-10-CM | POA: Diagnosis not present

## 2023-08-20 DIAGNOSIS — H5213 Myopia, bilateral: Secondary | ICD-10-CM | POA: Diagnosis not present

## 2023-08-28 DIAGNOSIS — R1011 Right upper quadrant pain: Secondary | ICD-10-CM | POA: Diagnosis not present

## 2023-08-28 DIAGNOSIS — Z9049 Acquired absence of other specified parts of digestive tract: Secondary | ICD-10-CM | POA: Diagnosis not present

## 2023-09-14 ENCOUNTER — Other Ambulatory Visit: Payer: Self-pay | Admitting: General Surgery

## 2023-09-14 DIAGNOSIS — R1011 Right upper quadrant pain: Secondary | ICD-10-CM

## 2023-09-18 ENCOUNTER — Ambulatory Visit
Admission: RE | Admit: 2023-09-18 | Discharge: 2023-09-18 | Disposition: A | Discharge status: Home / Self Care | Payer: BC Managed Care – PPO | Source: Ambulatory Visit | Attending: General Surgery | Admitting: General Surgery

## 2023-09-18 DIAGNOSIS — R1011 Right upper quadrant pain: Secondary | ICD-10-CM

## 2023-09-18 DIAGNOSIS — R932 Abnormal findings on diagnostic imaging of liver and biliary tract: Secondary | ICD-10-CM | POA: Diagnosis not present

## 2023-09-23 DIAGNOSIS — R059 Cough, unspecified: Secondary | ICD-10-CM | POA: Diagnosis not present

## 2023-09-23 DIAGNOSIS — J101 Influenza due to other identified influenza virus with other respiratory manifestations: Secondary | ICD-10-CM | POA: Diagnosis not present

## 2023-10-05 DIAGNOSIS — Z130 Encounter for screening for diseases of the blood and blood-forming organs and certain disorders involving the immune mechanism: Secondary | ICD-10-CM | POA: Diagnosis not present

## 2023-10-05 DIAGNOSIS — Z01419 Encounter for gynecological examination (general) (routine) without abnormal findings: Secondary | ICD-10-CM | POA: Diagnosis not present

## 2023-10-13 ENCOUNTER — Other Ambulatory Visit: Payer: Self-pay | Admitting: General Surgery

## 2023-10-13 DIAGNOSIS — Q440 Agenesis, aplasia and hypoplasia of gallbladder: Secondary | ICD-10-CM

## 2023-10-13 DIAGNOSIS — K8020 Calculus of gallbladder without cholecystitis without obstruction: Secondary | ICD-10-CM

## 2023-10-13 DIAGNOSIS — R1011 Right upper quadrant pain: Secondary | ICD-10-CM

## 2023-10-30 ENCOUNTER — Ambulatory Visit
Admission: RE | Admit: 2023-10-30 | Discharge: 2023-10-30 | Disposition: A | Discharge status: Home / Self Care | Source: Ambulatory Visit | Attending: General Surgery | Admitting: General Surgery

## 2023-10-30 DIAGNOSIS — K838 Other specified diseases of biliary tract: Secondary | ICD-10-CM | POA: Diagnosis not present

## 2023-10-30 DIAGNOSIS — Q440 Agenesis, aplasia and hypoplasia of gallbladder: Secondary | ICD-10-CM

## 2023-10-30 DIAGNOSIS — K8020 Calculus of gallbladder without cholecystitis without obstruction: Secondary | ICD-10-CM

## 2023-10-30 DIAGNOSIS — R1011 Right upper quadrant pain: Secondary | ICD-10-CM | POA: Diagnosis not present

## 2023-10-30 MED ORDER — GADOPICLENOL 0.5 MMOL/ML IV SOLN
6.0000 mL | Freq: Once | INTRAVENOUS | Status: AC | PRN
  Administered 2023-10-30: 6 mL via INTRAVENOUS

## 2023-11-23 DIAGNOSIS — Z09 Encounter for follow-up examination after completed treatment for conditions other than malignant neoplasm: Secondary | ICD-10-CM | POA: Diagnosis not present

## 2024-01-10 DIAGNOSIS — N390 Urinary tract infection, site not specified: Secondary | ICD-10-CM | POA: Diagnosis not present

## 2024-01-10 DIAGNOSIS — R300 Dysuria: Secondary | ICD-10-CM | POA: Diagnosis not present

## 2024-01-13 DIAGNOSIS — Z0000 Encounter for general adult medical examination without abnormal findings: Secondary | ICD-10-CM | POA: Diagnosis not present

## 2024-01-13 DIAGNOSIS — Z13220 Encounter for screening for lipoid disorders: Secondary | ICD-10-CM | POA: Diagnosis not present

## 2024-02-09 ENCOUNTER — Other Ambulatory Visit: Payer: Self-pay | Admitting: Family Medicine

## 2024-02-09 DIAGNOSIS — Z1231 Encounter for screening mammogram for malignant neoplasm of breast: Secondary | ICD-10-CM

## 2024-03-09 ENCOUNTER — Ambulatory Visit
Admission: RE | Admit: 2024-03-09 | Discharge: 2024-03-09 | Disposition: A | Discharge status: Home / Self Care | Source: Ambulatory Visit | Attending: Family Medicine | Admitting: Family Medicine

## 2024-03-09 DIAGNOSIS — Z1231 Encounter for screening mammogram for malignant neoplasm of breast: Secondary | ICD-10-CM
